# Patient Record
Sex: Male | Born: 1955 | Race: White | Hispanic: No | Marital: Married | Smoking: Former smoker
Health system: Southern US, Community
[De-identification: ages and names within clinical notes are randomized; demographics above are authoritative.]

## PROBLEM LIST (undated history)

## (undated) DIAGNOSIS — M1990 Unspecified osteoarthritis, unspecified site: Secondary | ICD-10-CM

## (undated) DIAGNOSIS — G4730 Sleep apnea, unspecified: Secondary | ICD-10-CM

## (undated) DIAGNOSIS — I10 Essential (primary) hypertension: Secondary | ICD-10-CM

## (undated) DIAGNOSIS — E785 Hyperlipidemia, unspecified: Secondary | ICD-10-CM

## (undated) HISTORY — PX: TONSILLECTOMY: SUR1361

## (undated) HISTORY — PX: WISDOM TOOTH EXTRACTION: SHX21

## (undated) HISTORY — PX: EYE SURGERY: SHX253

---

## 2013-04-14 DIAGNOSIS — R079 Chest pain, unspecified: Secondary | ICD-10-CM | POA: Insufficient documentation

## 2013-05-17 DIAGNOSIS — I10 Essential (primary) hypertension: Secondary | ICD-10-CM | POA: Insufficient documentation

## 2013-05-17 DIAGNOSIS — M5030 Other cervical disc degeneration, unspecified cervical region: Secondary | ICD-10-CM | POA: Insufficient documentation

## 2013-06-12 DIAGNOSIS — M542 Cervicalgia: Secondary | ICD-10-CM | POA: Insufficient documentation

## 2013-06-29 DIAGNOSIS — M5412 Radiculopathy, cervical region: Secondary | ICD-10-CM | POA: Insufficient documentation

## 2013-11-06 ENCOUNTER — Encounter (HOSPITAL_BASED_OUTPATIENT_CLINIC_OR_DEPARTMENT_OTHER): Payer: Self-pay | Admitting: Emergency Medicine

## 2013-11-06 ENCOUNTER — Emergency Department (HOSPITAL_BASED_OUTPATIENT_CLINIC_OR_DEPARTMENT_OTHER): Payer: BC Managed Care – PPO

## 2013-11-06 ENCOUNTER — Emergency Department (HOSPITAL_BASED_OUTPATIENT_CLINIC_OR_DEPARTMENT_OTHER)
Admission: EM | Admit: 2013-11-06 | Discharge: 2013-11-06 | Disposition: A | Discharge status: Home / Self Care | Payer: BC Managed Care – PPO | Attending: Emergency Medicine | Admitting: Emergency Medicine

## 2013-11-06 DIAGNOSIS — I10 Essential (primary) hypertension: Secondary | ICD-10-CM | POA: Insufficient documentation

## 2013-11-06 DIAGNOSIS — S62309A Unspecified fracture of unspecified metacarpal bone, initial encounter for closed fracture: Secondary | ICD-10-CM | POA: Insufficient documentation

## 2013-11-06 DIAGNOSIS — Y93B1 Activity, exercise machines primarily for muscle strengthening: Secondary | ICD-10-CM | POA: Insufficient documentation

## 2013-11-06 DIAGNOSIS — Z79899 Other long term (current) drug therapy: Secondary | ICD-10-CM | POA: Insufficient documentation

## 2013-11-06 DIAGNOSIS — Y929 Unspecified place or not applicable: Secondary | ICD-10-CM | POA: Insufficient documentation

## 2013-11-06 DIAGNOSIS — W010XXA Fall on same level from slipping, tripping and stumbling without subsequent striking against object, initial encounter: Secondary | ICD-10-CM | POA: Insufficient documentation

## 2013-11-06 DIAGNOSIS — Z87891 Personal history of nicotine dependence: Secondary | ICD-10-CM | POA: Insufficient documentation

## 2013-11-06 DIAGNOSIS — S62339A Displaced fracture of neck of unspecified metacarpal bone, initial encounter for closed fracture: Secondary | ICD-10-CM

## 2013-11-06 DIAGNOSIS — E785 Hyperlipidemia, unspecified: Secondary | ICD-10-CM | POA: Insufficient documentation

## 2013-11-06 HISTORY — DX: Essential (primary) hypertension: I10

## 2013-11-06 HISTORY — DX: Hyperlipidemia, unspecified: E78.5

## 2013-11-06 NOTE — ED Notes (Signed)
Injury to right hand sustained while moving a treadmill on 11/01/13. Bruising and pain noted.

## 2013-11-06 NOTE — ED Provider Notes (Signed)
CSN: [id number]     Arrival date & time 11/06/13  7829 History   First MD Initiated Contact with Patient 11/06/13 1000     Chief Complaint  Patient presents with  . Hand Injury   (Consider location/radiation/quality/duration/timing/severity/associated sxs/prior Treatment) Patient is a 57 y.o. male presenting with hand injury.  Hand Injury  Pt is right handed, reports 5 days ago he was moving a heavy Nordic-track exercise machine when it slipped and his R hand was forced onto the wall nearby. Complaining of moderate aching pain to ulnar right hand which has improved marginally since the injury. Pain is worse with movement.   Past Medical History  Diagnosis Date  . Hypertension   . Hyperlipidemia    Past Surgical History  Procedure Laterality Date  . Eye surgery     No family history on file. History  Substance Use Topics  . Smoking status: Former Games developer  . Smokeless tobacco: Not on file  . Alcohol Use: Yes     Comment: daily    Review of Systems All other systems reviewed and are negative except as noted in HPI.   Allergies  Review of patient's allergies indicates no known allergies.  Home Medications   Current Outpatient Rx  Name  Route  Sig  Dispense  Refill  . atorvastatin (LIPITOR) 10 MG tablet   Oral   Take 10 mg by mouth daily.         Marland Kitchen losartan (COZAAR) 100 MG tablet   Oral   Take 100 mg by mouth daily.          BP 161/90  Pulse 89  Temp(Src) 98.9 F (37.2 C) (Oral)  Resp 18  Ht 5\' 10"  (1.778 m)  Wt 242 lb (109.77 kg)  BMI 34.72 kg/m2  SpO2 98% Physical Exam  Constitutional: He is oriented to person, place, and time. He appears well-developed and well-nourished.  HENT:  Head: Normocephalic and atraumatic.  Neck: Neck supple.  Pulmonary/Chest: Effort normal.  Musculoskeletal: He exhibits edema (ulnar hand) and tenderness (5th metacarpal).  NVI  Neurological: He is alert and oriented to person, place, and time. No cranial nerve deficit.   Psychiatric: He has a normal mood and affect. His behavior is normal.    ED Course  Procedures (including critical care time) Labs Review Labs Reviewed - No data to display Imaging Review Dg Hand Complete Right  11/06/2013   CLINICAL DATA:  Crush injury with 5th metacarpal pain  EXAM: RIGHT HAND - COMPLETE 3+ VIEW  COMPARISON:  None.  FINDINGS: There is a boxer's type fracture of the 5th metacarpal with slight dorsal angulation. No extension to the articular surface. No other regional fracture.  IMPRESSION: Boxer's fracture of the 5th metacarpal   Electronically Signed   By: Paulina Fusi M.D.   On: 11/06/2013 10:28    EKG Interpretation   None       MDM   1. Boxer's fracture, closed, initial encounter     Xray images reviewed with the patient. Ulnar gutter splint, Hand followup. He declines pain meds. Ice and elevation as well.     Stark Aguinaga B. Bernette Mayers, MD 11/06/13 1046

## 2013-11-28 DIAGNOSIS — E669 Obesity, unspecified: Secondary | ICD-10-CM | POA: Insufficient documentation

## 2013-11-28 DIAGNOSIS — R7309 Other abnormal glucose: Secondary | ICD-10-CM | POA: Insufficient documentation

## 2014-09-30 DIAGNOSIS — R739 Hyperglycemia, unspecified: Secondary | ICD-10-CM | POA: Insufficient documentation

## 2015-07-04 DIAGNOSIS — F4329 Adjustment disorder with other symptoms: Secondary | ICD-10-CM | POA: Insufficient documentation

## 2015-07-04 DIAGNOSIS — G4730 Sleep apnea, unspecified: Secondary | ICD-10-CM | POA: Insufficient documentation

## 2015-10-06 DIAGNOSIS — R5382 Chronic fatigue, unspecified: Secondary | ICD-10-CM | POA: Insufficient documentation

## 2015-12-01 DIAGNOSIS — J209 Acute bronchitis, unspecified: Secondary | ICD-10-CM | POA: Insufficient documentation

## 2015-12-01 DIAGNOSIS — R7303 Prediabetes: Secondary | ICD-10-CM | POA: Insufficient documentation

## 2020-05-22 DIAGNOSIS — K760 Fatty (change of) liver, not elsewhere classified: Secondary | ICD-10-CM | POA: Insufficient documentation

## 2021-07-05 DIAGNOSIS — G4730 Sleep apnea, unspecified: Secondary | ICD-10-CM | POA: Insufficient documentation

## 2021-07-08 DIAGNOSIS — R972 Elevated prostate specific antigen [PSA]: Secondary | ICD-10-CM | POA: Insufficient documentation

## 2021-07-29 DIAGNOSIS — R1013 Epigastric pain: Secondary | ICD-10-CM | POA: Insufficient documentation

## 2021-07-29 DIAGNOSIS — Z0000 Encounter for general adult medical examination without abnormal findings: Secondary | ICD-10-CM | POA: Insufficient documentation

## 2022-01-22 ENCOUNTER — Other Ambulatory Visit (HOSPITAL_COMMUNITY): Payer: Self-pay | Admitting: Urology

## 2022-01-22 ENCOUNTER — Other Ambulatory Visit: Payer: Self-pay | Admitting: Urology

## 2022-01-22 DIAGNOSIS — C61 Malignant neoplasm of prostate: Secondary | ICD-10-CM

## 2022-01-26 ENCOUNTER — Telehealth: Payer: Self-pay | Admitting: Radiation Oncology

## 2022-01-26 NOTE — Telephone Encounter (Signed)
3/21 @ 9:44 am Left voicemail for patient to call our office.  ?

## 2022-01-29 ENCOUNTER — Encounter (HOSPITAL_COMMUNITY)
Admission: RE | Admit: 2022-01-29 | Discharge: 2022-01-29 | Disposition: A | Discharge status: Home / Self Care | Payer: Medicare Other | Source: Ambulatory Visit | Attending: Urology | Admitting: Urology

## 2022-01-29 ENCOUNTER — Other Ambulatory Visit: Payer: Self-pay

## 2022-01-29 DIAGNOSIS — C61 Malignant neoplasm of prostate: Secondary | ICD-10-CM | POA: Insufficient documentation

## 2022-01-29 MED ORDER — TECHNETIUM TC 99M MEDRONATE IV KIT
20.2000 | PACK | Freq: Once | INTRAVENOUS | Status: AC | PRN
  Administered 2022-01-29: 20.2 via INTRAVENOUS

## 2022-02-03 DIAGNOSIS — E785 Hyperlipidemia, unspecified: Secondary | ICD-10-CM | POA: Insufficient documentation

## 2022-02-03 NOTE — Progress Notes (Signed)
GU Location of Tumor / Histology: Prostate Ca ? ?If Prostate Cancer, Gleason Score is (4 + 3) and PSA is (4.3 as of 11/2021) ? ?Biopsies: ?Dr. Abner Greenspan ? ? ? ? ?Past/Anticipated interventions by urology, if any:  ? ? ?Past/Anticipated interventions by medical oncology, if any:  ?NA ?Weight changes, if any:  No ? ?IPSS:  2 ?SHIM:  14 ? ?Bowel/Bladder complaints, if any:  No ? ?Nausea/Vomiting, if any: No ? ?Pain issues, if any:  0/10 ? ?SAFETY ISSUES: ?Prior radiation?  No ?Pacemaker/ICD? No ?Possible current pregnancy? Male ?Is the patient on methotrexate? No ? ?Current Complaints / other details:   ?

## 2022-02-08 ENCOUNTER — Encounter: Payer: Self-pay | Admitting: Radiation Oncology

## 2022-02-08 ENCOUNTER — Other Ambulatory Visit: Payer: Self-pay

## 2022-02-08 ENCOUNTER — Ambulatory Visit
Admission: RE | Admit: 2022-02-08 | Discharge: 2022-02-08 | Disposition: A | Discharge status: Home / Self Care | Payer: Medicare Other | Source: Ambulatory Visit | Attending: Radiation Oncology | Admitting: Radiation Oncology

## 2022-02-08 DIAGNOSIS — C61 Malignant neoplasm of prostate: Secondary | ICD-10-CM

## 2022-02-08 NOTE — Progress Notes (Addendum)
?Radiation Oncology         [phone] ?________________________________ ?  ?Initial Outpatient Consultation - Conducted via face-to-face Video due to current COVID-19 concerns for limiting patient exposure ? ?Name: Neema Fluegge MRN: [id number]  ?Date: 02/08/2022  DOB: [birth year] ? ?[id number], Ronalee Belts, MD  Janith Lima, MD  ? ?REFERRING PHYSICIAN: Janith Lima, MD ? ?DIAGNOSIS: 66 y.o. gentleman with Stage T1c adenocarcinoma of the prostate with Gleason score of 4+3, and PSA of 4.3. ? ?  ICD-10-CM   ?1. Malignant neoplasm of prostate ([facility])  C61   ?  ? ? ?HISTORY OF PRESENT ILLNESS: Lamond Glantz is a 66 y.o. male with a diagnosis of prostate cancer. He was noted to have an elevated PSA of 4.5 in 8/22 and had negative prostate biopsy in 10/22 with TRUS volume of 42 cc.  PSA remained elevated at 4.3 in 1/23.  MRI on 11/24/21 showed 2 separat PI-RADs 4 lesions in the right anterior transition zone.  Accordingly, he was referred for evaluation in urology by Dr. Abner Greenspan,  and proceeded to transrectal ultrasound with 2 MRI fusion biopsies and 12 standard biopsies of the prostate on 01/14/22.  The prostate volume measured 45 cc.  Out of 20 cores, 4 were positive with maximum Gleason score was 4+3 seen in one of the MRI regions. ? ?The patient reviewed the biopsy results with his urologist and he has kindly been referred today for discussion of potential radiation treatment options. ? ? ?PREVIOUS RADIATION THERAPY: No ? ?PAST MEDICAL HISTORY:  ?Past Medical History:  ?Diagnosis Date  ? Hyperlipidemia   ? Hypertension   ?   ? ?PAST SURGICAL HISTORY: ?Past Surgical History:  ?Procedure Laterality Date  ? EYE SURGERY    ? ? ?FAMILY HISTORY: No family history on file. ? ?SOCIAL HISTORY:  ?Social History  ? ?Socioeconomic History  ? Marital status: Married  ?  Spouse name: Not on file  ? Number of children: Not on file  ? Years of education: Not on file  ? Highest education level: Not on file  ?Occupational History  ? Not on file   ?Tobacco Use  ? Smoking status: Former  ?  Packs/day: 1.00  ?  Years: 20.00  ?  Pack years: 20.00  ?  Types: Cigarettes  ?  Quit date: 01/07/2004  ?  Years since quitting: 18.1  ? Smokeless tobacco: Not on file  ?Substance and Sexual Activity  ? Alcohol use: Yes  ?  Comment: daily  ? Drug use: No  ? Sexual activity: Not on file  ?Other Topics Concern  ? Not on file  ?Social History Narrative  ? Not on file  ? ?Social Determinants of Health  ? ?Financial Resource Strain: Not on file  ?Food Insecurity: Not on file  ?Transportation Needs: Not on file  ?Physical Activity: Not on file  ?Stress: Not on file  ?Social Connections: Not on file  ?Intimate Partner Violence: Not on file  ? ? ?ALLERGIES: Patient has no known allergies. ? ?MEDICATIONS:  ?Current Outpatient Medications  ?Medication Sig Dispense Refill  ? atorvastatin (LIPITOR) 40 MG tablet Take 1 tablet by mouth daily.    ? ezetimibe (ZETIA) 10 MG tablet Take 10 mg by mouth daily.    ? hydrochlorothiazide (HYDRODIURIL) 25 MG tablet Take 1 tablet by mouth daily.    ? losartan (COZAAR) 100 MG tablet Take 100 mg by mouth daily.    ? Zoster Vaccine Adjuvanted [facility]) injection INJECT 0.5ML INTRAMUSCULARLY AS DIRECTEDAND  REPEAT IN 2 TO 6 MONTHS    ? atorvastatin (LIPITOR) 10 MG tablet Take 10 mg by mouth daily. (Patient not taking: Reported on 02/08/2022)    ? pantoprazole (PROTONIX) 40 MG tablet Take 1 tablet by mouth daily. (Patient not taking: Reported on 02/08/2022)    ? ?No current facility-administered medications for this encounter.  ? ? ?REVIEW OF SYSTEMS:  On review of systems, the patient reports that he is doing well overall. He denies any chest pain, shortness of breath, cough, fevers, chills, night sweats, unintended weight changes. He denies any bowel disturbances, and denies abdominal pain, nausea or vomiting. He denies any new musculoskeletal or joint aches or pains. His IPSS was Total Score: 2, indicating mild urinary symptoms (Reference 0-7 mild, 8-19  moderate, 20-35 severe).  His SHIM: 17, indicating he moderate erectile dysfunction (Reference - 22-25 None, 17-21 Mild, 8-16 Moderate, 1-7 Severe). A complete review of systems is obtained and is otherwise negative. ?  ?PHYSICAL EXAM:  ?Wt Readings from Last 3 Encounters:  ?02/08/22 260 lb (117.9 kg)  ?11/06/13 242 lb (109.8 kg)  ? ?Temp Readings from Last 3 Encounters:  ?11/06/13 98.9 ?F (37.2 ?C) (Oral)  ? ?BP Readings from Last 3 Encounters:  ?11/06/13 161/90  ? ?Pulse Readings from Last 3 Encounters:  ?11/06/13 89  ? ?Pain Assessment ?Pain Score: 0-No pain/10 ? ?In general this is a well appearing gentleman in no acute distress. He's alert and oriented x4 and appropriate throughout the examination. Cardiopulmonary assessment is negative for acute distress, and he exhibits normal effort.   ? ?KPS = 100 ? ?100 - Normal; no complaints; no evidence of disease. ?90   - Able to carry on normal activity; minor signs or symptoms of disease. ?80   - Normal activity with effort; some signs or symptoms of disease. ?104   - Cares for self; unable to carry on normal activity or to do active work. ?60   - Requires occasional assistance, but is able to care for most of his personal needs. ?50   - Requires considerable assistance and frequent medical care. ?59   - Disabled; requires special care and assistance. ?30   - Severely disabled; hospital admission is indicated although death not imminent. ?20   - Very sick; hospital admission necessary; active supportive treatment necessary. ?10   - Moribund; fatal processes progressing rapidly. ?0     - Dead ? ?Karnofsky DA, Abelmann WH, Craver LS and Burchenal [facility] [phone]) The use of the nitrogen mustards in the palliative treatment of carcinoma: with particular reference to bronchogenic carcinoma Cancer 1 [phone] ? ?LABORATORY DATA:  ?No results found for: WBC, HGB, HCT, MCV, PLT ?No results found for: NA, K, CL, CO2 ?No results found for: ALT, AST, GGT, ALKPHOS, BILITOT ?   ?RADIOGRAPHY: NM Bone Scan Whole Body ? ?Result Date: 02/01/2022 ?CLINICAL DATA:  History of prostate cancer. EXAM: NUCLEAR MEDICINE WHOLE BODY BONE SCAN TECHNIQUE: Whole body anterior and posterior images were obtained approximately 3 hours after intravenous injection of radiopharmaceutical. RADIOPHARMACEUTICALS:  20.2 mCi Technetium-51mMDP IV COMPARISON:  CT 01/29/2022. FINDINGS: Bilateral renal function and excretion. Focal areas of increased activity noted over both shoulders and sternoclavicular joints most likely degenerative. Mild focal increased activity noted over the left knee joint, most likely degenerative. Mild multifocal areas of increased activity noted over the thoracic and lumbar spine, most likely degenerative. Metastatic disease cannot be completely excluded. MRI of the thoracic and lumbar spine can be obtained for further evaluation  as needed. IMPRESSION: 1. Mild multifocal areas of increased activity noted over the thoracic and lumbar spine, most likely degenerative. Metastatic disease cannot be completely excluded. MRI of the thoracic and lumbar spine can be obtained for further evaluation as needed. 2. Focal areas of increased activity noted over both shoulders, sternoclavicular joints, and left knee, most likely degenerative. Electronically Signed   By: Marcello Moores  Register M.D.   On: 02/01/2022 10:19   ?   ?IMPRESSION/PLAN: ?1. 66 y.o. gentleman with Stage T1c adenocarcinoma of the prostate with Gleason score of 4+3, and PSA of 4.3. ?We discussed the patient's workup and outlined the nature of prostate cancer in this setting. The patient's T stage, Gleason's score, and PSA put him into the Unfavorable Intermediate risk group. Accordingly, he is eligible for a variety of potential treatment options including ADT in combination with brachytherapy, 5.5-8 weeks of external radiation, or prostatectomy. We discussed the available radiation techniques, and focused on the details and logistics of  delivery.  We discussed and outlined the risks, benefits, short and long-term effects associated with radiotherapy and compared and contrasted these with prostatectomy. We discussed the role of SpaceOAR gel in reducing the r

## 2022-02-25 ENCOUNTER — Other Ambulatory Visit: Payer: Self-pay | Admitting: Urology

## 2022-06-04 NOTE — Patient Instructions (Signed)
DUE TO SPACE LIMITATIONS, ONLY TWO VISITORS  (aged 67 and older) ARE ALLOWED TO COME WITH YOU AND STAY IN THE WAITING ROOM DURING YOUR PRE OP AND PROCEDURE.   **NO VISITORS ARE ALLOWED IN THE SHORT STAY AREA OR RECOVERY ROOM!!**  IF YOU WILL BE ADMITTED INTO THE HOSPITAL YOU ARE ALLOWED ONLY FOUR SUPPORT PEOPLE DURING VISITATION HOURS (7 AM -8PM)   The support person(s) must pass our screening, and use Hand sanitizing gel. Visitors GUEST BADGE MUST BE WORN VISIBLY  One adult visitor may remain with you overnight and MUST be in the room by 8 P.M.   You are not required to quarantine at this time prior to your surgery. However, you must do this: Hand Hygiene often Do NOT share personal items Notify your provider if you are in close contact with someone who has COVID or you develop fever 100.4 or greater, new onset of sneezing, cough, sore throat, shortness of breath or body aches.       Your procedure is scheduled on:  Monday June 14, 2022  Report to [facility] Main Entrance.  Report to admitting at:  05:15   AM  +++++Call this number if you have any questions or problems the morning of surgery [phone]   HAVE A CLEAR LIQUID DIET THE DAY BEFORE YOUR SURGERY.  MIRALAX - Dissolve 17 grams in 4 ounces of water AND DRINK AT NOON DAY BEFORE YOUR SURGERY  FLEET ENEMA USE ONE FLEET ENEMA NIGHT BEFORE SURGERY              After Midnight you may have the following liquids until   04:15  AM   Clear Liquid Diet Water Black Coffee (sugar ok, NO MILK/CREAM OR CREAMERS)  Tea (sugar ok, NO MILK/CREAM OR CREAMERS) regular and decaf                             Plain Jell-O (NO RED)                                           Fruit ices (not with fruit pulp, NO RED)                                     Popsicles (NO RED)                                                                  Juice: apple, WHITE grape, WHITE cranberry Sports drinks like Gatorade (NO RED)                 FOLLOW ANY ADDITIONAL PRE OP INSTRUCTIONS YOU RECEIVED FROM YOUR SURGEON'S OFFICE!!!   Oral Hygiene is also important to reduce your risk of infection.        Remember - BRUSH YOUR TEETH THE MORNING OF SURGERY WITH YOUR REGULAR TOOTHPASTE    Take ONLY these medicines the morning of surgery with A SIP OF WATER: Zetia, Klonopin and Protonix if needed   Bring CPAP mask and tubing day of  surgery.                   You may not have any metal on your body including  jewelry, and body piercing  Do not wear lotions, powders, cologne, or deodorant  Men may shave face and neck.  Contacts, Hearing Aids, dentures or bridgework may not be worn into surgery.   You may bring a small overnight bag with you on the day of surgery, only pack items that are not valuable .[facility] IS NOT RESPONSIBLE   FOR VALUABLES THAT ARE LOST OR STOLEN.   DO NOT [place]. PHARMACY WILL DISPENSE MEDICATIONS LISTED ON YOUR MEDICATION LIST TO YOU DURING YOUR ADMISSION [place]!   Special Instructions: Bring a copy of your healthcare power of attorney and living will documents the day of surgery, if you wish to have them scanned into your [facility] Records- EPIC  Please read over the following fact sheets you were given: IF YOU HAVE QUESTIONS ABOUT YOUR PRE-OP INSTRUCTIONS, PLEASE CALL [phone]  ([place])   [facility] - Preparing for Surgery Before surgery, you can play an important role.  Because skin is not sterile, your skin needs to be as free of germs as possible.  You can reduce the number of germs on your skin by washing with CHG (chlorahexidine gluconate) soap before surgery.  CHG is an antiseptic cleaner which kills germs and bonds with the skin to continue killing germs even after washing. Please DO NOT use if you have an allergy to CHG or antibacterial soaps.  If your skin becomes reddened/irritated stop using the CHG and inform your nurse when you  arrive at Short Stay. Do not shave (including legs and underarms) for at least 48 hours prior to the first CHG shower.  You may shave your face/neck.  Please follow these instructions carefully:  1.  Shower with CHG Soap the night before surgery and the  morning of surgery.  2.  If you choose to wash your hair, wash your hair first as usual with your normal  shampoo.  3.  After you shampoo, rinse your hair and body thoroughly to remove the shampoo.                             4.  Use CHG as you would any other liquid soap.  You can apply chg directly to the skin and wash.  Gently with a scrungie or clean washcloth.  5.  Apply the CHG Soap to your body ONLY FROM THE NECK DOWN.   Do not use on face/ open                           Wound or open sores. Avoid contact with eyes, ears mouth and genitals (private parts).                       Wash face,  Genitals (private parts) with your normal soap.             6.  Wash thoroughly, paying special attention to the area where your  surgery  will be performed.  7.  Thoroughly rinse your body with warm water from the neck down.  8.  DO NOT shower/wash with your normal soap after using and rinsing off the CHG Soap.  9.  Pat yourself dry with a clean towel.            10.  Wear clean pajamas.            11.  Place clean sheets on your bed the night of your first shower and do not  sleep with pets.  ON THE DAY OF SURGERY : Do not apply any lotions/deodorants the morning of surgery.  Please wear clean clothes to the hospital/surgery center.    FAILURE TO FOLLOW THESE INSTRUCTIONS MAY RESULT IN THE CANCELLATION OF YOUR SURGERY  PATIENT SIGNATURE_________________________________  NURSE SIGNATURE__________________________________  ________________________________________________________________________   Adam Phenix    An incentive spirometer is a tool that can help keep your lungs clear and active. This tool measures how well  you are filling your lungs with each breath. Taking long deep breaths may help reverse or decrease the chance of developing breathing (pulmonary) problems (especially infection) following: A long period of time when you are unable to move or be active. BEFORE THE PROCEDURE  If the spirometer includes an indicator to show your best effort, your nurse or respiratory therapist will set it to a desired goal. If possible, sit up straight or lean slightly forward. Try not to slouch. Hold the incentive spirometer in an upright position. INSTRUCTIONS FOR USE  Sit on the edge of your bed if possible, or sit up as far as you can in bed or on a chair. Hold the incentive spirometer in an upright position. Breathe out normally. Place the mouthpiece in your mouth and seal your lips tightly around it. Breathe in slowly and as deeply as possible, raising the piston or the ball toward the top of the column. Hold your breath for 3-5 seconds or for as long as possible. Allow the piston or ball to fall to the bottom of the column. Remove the mouthpiece from your mouth and breathe out normally. Rest for a few seconds and repeat Steps 1 through 7 at least 10 times every 1-2 hours when you are awake. Take your time and take a few normal breaths between deep breaths. The spirometer may include an indicator to show your best effort. Use the indicator as a goal to work toward during each repetition. After each set of 10 deep breaths, practice coughing to be sure your lungs are clear. If you have an incision (the cut made at the time of surgery), support your incision when coughing by placing a pillow or rolled up towels firmly against it. Once you are able to get out of bed, walk around indoors and cough well. You may stop using the incentive spirometer when instructed by your caregiver.  RISKS AND COMPLICATIONS Take your time so you do not get dizzy or light-headed. If you are in pain, you may need to take or ask for  pain medication before doing incentive spirometry. It is harder to take a deep breath if you are having pain. AFTER USE Rest and breathe slowly and easily. It can be helpful to keep track of a log of your progress. Your caregiver can provide you with a simple table to help with this. If you are using the spirometer at home, follow these instructions: [place] IF:  You are having difficultly using the spirometer. You have trouble using the spirometer as often as instructed. Your pain medication is not giving enough relief while using the spirometer. You develop fever of 100.5 F (38.1 C) or higher.  SEEK IMMEDIATE MEDICAL CARE IF:  You cough up bloody sputum that had not been present before. You develop fever of 102 F (38.9 C) or greater. You develop worsening pain at or near the incision site. MAKE SURE YOU:  Understand these instructions. Will watch your condition. Will get help right away if you are not doing well or get worse. Document Released: 03/07/2007 Document Revised: 01/17/2012 Document Reviewed: 05/08/2007 [facility] Patient Information 2014 [place], [place].

## 2022-06-04 NOTE — Progress Notes (Addendum)
COVID Vaccine received:  []  No [x]  Yes  Date of any COVID positive Test in last 90 days:  PCP - Patria Mane, MD Cardiologist - none  Chest x-ray - 06-2021  CEW EKG -   Stress Test - 20+ years ago doesn't remember  ECHO -  Cardiac Cath -   Pacemaker/ICD device     [x]  N/A Spinal Cord Stimulator:[x]  No []  Yes   Other Implants:   Bowel Prep - Clears day prior, Miralax and Fleet Enema  History of Sleep Apnea? []  No [x]  Yes  CPAP used?- []  No [x]  Yes  (Instruct to bring their mask & Tubing)  Does the patient monitor blood sugar? [x]  No []  Yes  []  N/A  Blood Thinner Instructions:None Aspirin Instructions: Last Dose:  ERAS Protocol Ordered: [x]  No  []  Yes  Comments:   Anesthesia review: pre-DM, HTN, Fatty liver  Patient denies shortness of breath, fever, cough and chest pain at PAT appointment  Patient verbalized understanding and agreement to the Pre-Surgical Instructions that were given to them at this PAT appointment. Patient was also educated of the need to review these PAT instructions again prior to his/her surgery.I reviewed the appropriate phone numbers to call if they have any and questions or concerns.

## 2022-06-07 ENCOUNTER — Encounter (HOSPITAL_COMMUNITY): Payer: Self-pay

## 2022-06-07 ENCOUNTER — Encounter (HOSPITAL_COMMUNITY)
Admission: RE | Admit: 2022-06-07 | Discharge: 2022-06-07 | Disposition: A | Discharge status: Home / Self Care | Payer: Medicare Other | Source: Ambulatory Visit | Attending: Urology | Admitting: Urology

## 2022-06-07 ENCOUNTER — Other Ambulatory Visit: Payer: Self-pay

## 2022-06-07 VITALS — BP 130/81 | HR 58 | Temp 98.4°F | Resp 14 | Ht 70.0 in | Wt 249.0 lb

## 2022-06-07 DIAGNOSIS — R7303 Prediabetes: Secondary | ICD-10-CM | POA: Diagnosis not present

## 2022-06-07 DIAGNOSIS — I10 Essential (primary) hypertension: Secondary | ICD-10-CM | POA: Diagnosis not present

## 2022-06-07 DIAGNOSIS — Z01812 Encounter for preprocedural laboratory examination: Secondary | ICD-10-CM | POA: Insufficient documentation

## 2022-06-07 DIAGNOSIS — K769 Liver disease, unspecified: Secondary | ICD-10-CM | POA: Diagnosis not present

## 2022-06-07 HISTORY — DX: Sleep apnea, unspecified: G47.30

## 2022-06-07 HISTORY — DX: Unspecified osteoarthritis, unspecified site: M19.90

## 2022-06-07 LAB — COMPREHENSIVE METABOLIC PANEL
ALT: 31 U/L (ref 0–44)
AST: 28 U/L (ref 15–41)
Albumin: 3.9 g/dL (ref 3.5–5.0)
Alkaline Phosphatase: 58 U/L (ref 38–126)
Anion gap: 10 (ref 5–15)
BUN: 15 mg/dL (ref 8–23)
CO2: 24 mmol/L (ref 22–32)
Calcium: 9 mg/dL (ref 8.9–10.3)
Chloride: 104 mmol/L (ref 98–111)
Creatinine, Ser: 0.93 mg/dL (ref 0.61–1.24)
GFR, Estimated: >60 mL/min (ref >60)
Glucose, Bld: 105 mg/dL — ABNORMAL HIGH (ref 70–99)
Potassium: 3.9 mmol/L (ref 3.5–5.1)
Sodium: 138 mmol/L (ref 135–145)
Total Bilirubin: 0.8 mg/dL (ref 0.3–1.2)
Total Protein: 7.2 g/dL (ref 6.5–8.1)

## 2022-06-07 LAB — CBC
HCT: 42.4 % (ref 39.0–52.0)
Hemoglobin: 14.2 g/dL (ref 13.0–17.0)
MCH: 29.6 pg (ref 26.0–34.0)
MCHC: 33.5 g/dL (ref 30.0–36.0)
MCV: 88.3 fL (ref 80.0–100.0)
Platelets: 219 10*3/uL (ref 150–400)
RBC: 4.8 MIL/uL (ref 4.22–5.81)
RDW: 14.5 % (ref 11.5–15.5)
WBC: 6.6 10*3/uL (ref 4.0–10.5)
nRBC: 0 % (ref 0.0–0.2)

## 2022-06-07 LAB — HEMOGLOBIN A1C
Hgb A1c MFr Bld: 6.1 % — ABNORMAL HIGH (ref 4.8–5.6)
Mean Plasma Glucose: 128.37 mg/dL

## 2022-06-07 LAB — GLUCOSE, CAPILLARY: Glucose-Capillary: 117 mg/dL — ABNORMAL HIGH (ref 70–99)

## 2022-06-11 NOTE — H&P (Signed)
Office Visit Report     05/21/2022   --------------------------------------------------------------------------------   Ernest Meyer  MRN: [id number]  DOB: 02-15-1956, 66 year old Male  SSN:    PRIMARY CARE:    REFERRING:  Ernest Meyer, M  PROVIDER:  Rexene Meyer, M.D.  TREATING:  Ernest Crocker, NP  LOCATION:  [facility] [phone]     --------------------------------------------------------------------------------   CC/HPI: CC: Prostate Cancer   Physician requesting consult: Dr. Windell Meyer  PCP:   Ernest Meyer is a 66 year old gentleman who was noted to have an elevated PSA of 4.5 in the fall of 2022 prompting urologic evaluation and TRUS biopsy by Dr. Baruch Gouty in October 2022 that was negative for malignancy. An MRI of the prostate was obtained in January 2023 when his PSA was still 4.3. This indicated two separate PI-RAD 4 lesions of the right anterior transition zone. He was seen by Dr. Abner Greenspan and an MR/US fusion biopsy was performed on 01/14/22 which confirmed one of the MR lesions to have all 4 cores positive for malignancy with one core indicating Gleason 4+3=7 adenocarcinoma. The remaining biopsies were negative with a total of 4 out of 20 biopsy cores positive.   Family history: None.   Imaging studies:  MRI (11/24/21): No EPE, SVI, LAD, or bone lesions.  CT abd/pelvis (01/29/22): Negative for metastatic disease.  Bone scan (01/29/22): Negative for metastatic disease.   PMH: He has a history of hypertension, hyperglycemia, and sleep apnea.  PSH: No abdominal surgeries.   TNM stage: cT1c N0 M0  PSA: 4.3  Gleason score: 4+3=7 (GG 3)  Biopsy (01/14/22): 4/20 cores positive  Left: Benign  Right: Benign  MR targets: ROI 1 - 4/4 cores positive (60%, 70%, 40%, 25%), ROI 2 - Benign  Prostate volume: 45 cc   Nomogram  OC disease: 66%  EPE: 33%  SVI: 3%  LNI: 4%  PFS (5 year, 10 year): 76%, 63%   Urinary function: IPSS is 20.  Erectile function:  SHIM score is 4.   05/21/2022. Patient here today for preoperative appointment prior to undergoing robotic prostatectomy and bilateral pelvic lymph node dissection with Dr. Alinda Meyer on August 7. Overall doing well. Denies any changes in past medical history, prescription medications taken on a daily basis, no interval surgical or procedural intervention. He continues to void at his baseline with stable symptomology. Denies any interval dysuria, gross hematuria. No interval treatment for UTI or other infectious process. Patient denies any recent chest pain, shortness of breath, lightheadedness or dizziness. Denies any interval fevers or chills, nausea/vomiting.     ALLERGIES: No Known Drug Allergies    MEDICATIONS: Hydrochlorothiazide  Atorvastatin Calcium  Ezetimibe  Losartan Potassium     GU PSH: Locm 300-'399Mg'$ /Ml Iodine,1Ml - 01/29/2022 Prostate Needle Biopsy - 01/14/2022     NON-GU PSH: Surgical Pathology, Gross And Microscopic Examination For Prostate Needle - 01/14/2022     GU PMH: Stress Incontinence - 04/21/2022, - 03/22/2022, - 02/16/2022 Prostate Cancer - 02/16/2022, - 02/09/2022, - 01/29/2022, - 01/21/2022 Elevated PSA - 01/14/2022    NON-GU PMH: Muscle weakness (generalized) - 04/21/2022, - 03/22/2022, - 02/16/2022 Other muscle spasm - 04/21/2022, - 03/22/2022, - 02/16/2022 Hypercholesterolemia Hypertension Sleep Apnea    FAMILY HISTORY: 1 Daughter - Runs in Family 2 sons - Runs in Family   SOCIAL HISTORY: Marital Status: Unknown Current Smoking Status: Patient does not smoke anymore. Has not smoked since 01/07/2004. Smoked for 20 years.   Tobacco  Use Assessment Completed: Used Tobacco in last 30 days? Does drink.  Drinks 1 caffeinated drink per day.    REVIEW OF SYSTEMS:    GU Review Male:   Patient reports get up at night to urinate. Patient denies frequent urination, hard to postpone urination, burning/ pain with urination, leakage of urine, stream starts and stops, trouble  starting your stream, have to strain to urinate , erection problems, and penile pain.  Gastrointestinal (Upper):   Patient denies nausea, vomiting, and indigestion/ heartburn.  Gastrointestinal (Lower):   Patient denies diarrhea and constipation.  Constitutional:   Patient denies fever, night sweats, weight loss, and fatigue.  Skin:   Patient denies skin rash/ lesion and itching.  Eyes:   Patient denies blurred vision and double vision.  Ears/ Nose/ Throat:   Patient denies sore throat and sinus problems.  Hematologic/Lymphatic:   Patient denies swollen glands and easy bruising.  Cardiovascular:   Patient denies leg swelling and chest pains.  Respiratory:   Patient denies cough and shortness of breath.  Endocrine:   Patient denies excessive thirst.  Musculoskeletal:   Patient denies back pain and joint pain.  Neurological:   Patient denies headaches and dizziness.  Psychologic:   Patient denies depression and anxiety.   VITAL SIGNS:      05/21/2022 08:31 AM  Weight 250 lb / 113.4 kg  Height 70 in / 177.8 cm  BP 115/75 mmHg  Heart Rate 53 /min  Temperature 98.4 F / 36.8 C  BMI 35.9 kg/m   MULTI-SYSTEM PHYSICAL EXAMINATION:    Constitutional: Well-nourished. No physical deformities. Normally developed. Good grooming.  Neck: Neck symmetrical, not swollen. Normal tracheal position.  Respiratory: Normal breath sounds. No labored breathing, no use of accessory muscles.   Cardiovascular: Regular rate and rhythm. No murmur, no gallop. Normal temperature, normal extremity pulses, no swelling, no varicosities.   Skin: No paleness, no jaundice, no cyanosis. No lesion, no ulcer, no rash.  Neurologic / Psychiatric: Oriented to time, oriented to place, oriented to person. No depression, no anxiety, no agitation.  Gastrointestinal: Obese abdomen. No mass, no tenderness, no rigidity.   Musculoskeletal: Normal gait and station of head and neck.     Complexity of Data:  Source Of History:   Patient, Medical Record Summary  Lab Test Review:   PSA  Records Review:   Pathology Reports, Previous Doctor Records, Previous Hospital Records, Previous Patient Records  Urine Test Review:   Urinalysis  X-Ray Review: C.T. Abdomen/Pelvis: Reviewed Films. Reviewed Report.     05/21/22  Urinalysis  Urine Appearance Clear   Urine Color Yellow   Urine Glucose Neg mg/dL  Urine Bilirubin Neg mg/dL  Urine Ketones Neg mg/dL  Urine Specific Gravity 1.015   Urine Blood Neg ery/uL  Urine pH 6.5   Urine Protein Neg mg/dL  Urine Urobilinogen 0.2 mg/dL  Urine Nitrites Neg   Urine Leukocyte Esterase Neg leu/uL   PROCEDURES:          Urinalysis Dipstick Dipstick Cont'd  Color: Yellow Bilirubin: Neg mg/dL  Appearance: Clear Ketones: Neg mg/dL  Specific Gravity: 1.015 Blood: Neg ery/uL  pH: 6.5 Protein: Neg mg/dL  Glucose: Neg mg/dL Urobilinogen: 0.2 mg/dL    Nitrites: Neg    Leukocyte Esterase: Neg leu/uL    ASSESSMENT:      ICD-10 Details  1 GU:   Prostate Cancer - C61 Chronic, Threat to Bodily Function  2 NON-GU:   Encounter for other preprocedural examination - Z01.818 Undiagnosed New Problem  PLAN:           Orders Labs Urine Culture          Schedule Return Visit/Planned Activity: Keep Scheduled Appointment - Follow up MD, Schedule Surgery          Document Letter(s):  Created for Chart: Work Excuse   Created for Patient: Clinical Summary         Notes:   All questions answered to the best my ability regarding the upcoming procedure and expected postoperative course with understanding expressed by the patient. Precautionary urine culture sent today to serve as a baseline. He will follow-up with pelvic floor physical therapy on 7/21. He will proceed with previously scheduled prostatectomy with Dr. Alinda Meyer on 8/7.        Next Appointment:      Next Appointment: 05/28/2022 09:00 AM    Appointment Type: 60 Physical Therapy    Location: [facility] [phone]    Provider: Lovenia Kim    Reason for Visit: PT      * Signed by Ernest Crocker, NP on 05/21/22 at 8:58 AM (EDT*

## 2022-06-14 ENCOUNTER — Encounter (HOSPITAL_COMMUNITY)
Admission: RE | Disposition: A | Discharge status: Home / Self Care | Payer: Self-pay | Source: Home / Self Care | Attending: Urology

## 2022-06-14 ENCOUNTER — Ambulatory Visit (HOSPITAL_COMMUNITY): Payer: Medicare Other | Admitting: Registered Nurse

## 2022-06-14 ENCOUNTER — Ambulatory Visit (HOSPITAL_BASED_OUTPATIENT_CLINIC_OR_DEPARTMENT_OTHER): Payer: Medicare Other | Admitting: Registered Nurse

## 2022-06-14 ENCOUNTER — Encounter (HOSPITAL_COMMUNITY): Payer: Self-pay | Admitting: Urology

## 2022-06-14 ENCOUNTER — Observation Stay (HOSPITAL_COMMUNITY)
Admission: RE | Admit: 2022-06-14 | Discharge: 2022-06-15 | Disposition: A | Discharge status: Home / Self Care | Payer: Medicare Other | Attending: Urology | Admitting: Urology

## 2022-06-14 ENCOUNTER — Other Ambulatory Visit: Payer: Self-pay

## 2022-06-14 DIAGNOSIS — Z79899 Other long term (current) drug therapy: Secondary | ICD-10-CM | POA: Diagnosis not present

## 2022-06-14 DIAGNOSIS — Z87891 Personal history of nicotine dependence: Secondary | ICD-10-CM | POA: Diagnosis not present

## 2022-06-14 DIAGNOSIS — I10 Essential (primary) hypertension: Secondary | ICD-10-CM | POA: Insufficient documentation

## 2022-06-14 DIAGNOSIS — R7303 Prediabetes: Secondary | ICD-10-CM

## 2022-06-14 DIAGNOSIS — C61 Malignant neoplasm of prostate: Secondary | ICD-10-CM | POA: Diagnosis not present

## 2022-06-14 DIAGNOSIS — G4730 Sleep apnea, unspecified: Secondary | ICD-10-CM | POA: Insufficient documentation

## 2022-06-14 HISTORY — PX: ROBOT ASSISTED LAPAROSCOPIC RADICAL PROSTATECTOMY: SHX5141

## 2022-06-14 HISTORY — PX: LYMPHADENECTOMY: SHX5960

## 2022-06-14 LAB — GLUCOSE, CAPILLARY: Glucose-Capillary: 113 mg/dL — ABNORMAL HIGH (ref 70–99)

## 2022-06-14 LAB — HEMOGLOBIN AND HEMATOCRIT, BLOOD
HCT: 42.1 % (ref 39.0–52.0)
Hemoglobin: 13.8 g/dL (ref 13.0–17.0)

## 2022-06-14 SURGERY — XI ROBOTIC ASSISTED LAPAROSCOPIC RADICAL PROSTATECTOMY LEVEL 2
Anesthesia: General

## 2022-06-14 MED ORDER — ATORVASTATIN CALCIUM 40 MG PO TABS
40.0000 mg | ORAL_TABLET | Freq: Every day | ORAL | Status: DC
  Administered 2022-06-14: 40 mg via ORAL
  Filled 2022-06-14: qty 1

## 2022-06-14 MED ORDER — ROCURONIUM BROMIDE 10 MG/ML (PF) SYRINGE
PREFILLED_SYRINGE | INTRAVENOUS | Status: DC | PRN
  Administered 2022-06-14: 20 mg via INTRAVENOUS
  Administered 2022-06-14: 70 mg via INTRAVENOUS
  Administered 2022-06-14: 30 mg via INTRAVENOUS

## 2022-06-14 MED ORDER — SODIUM CHLORIDE 0.9 % IR SOLN
Status: DC | PRN
  Administered 2022-06-14: 1000 mL via INTRAVESICAL

## 2022-06-14 MED ORDER — ACETAMINOPHEN 325 MG PO TABS: 650.0000 mg | ORAL_TABLET | ORAL | Status: DC | PRN

## 2022-06-14 MED ORDER — ROCURONIUM BROMIDE 10 MG/ML (PF) SYRINGE
PREFILLED_SYRINGE | INTRAVENOUS | Status: AC
  Filled 2022-06-14: qty 10

## 2022-06-14 MED ORDER — ONDANSETRON HCL 4 MG/2ML IJ SOLN
INTRAMUSCULAR | Status: DC | PRN
  Administered 2022-06-14: 4 mg via INTRAVENOUS

## 2022-06-14 MED ORDER — FENTANYL CITRATE (PF) 250 MCG/5ML IJ SOLN
INTRAMUSCULAR | Status: AC
  Filled 2022-06-14: qty 5

## 2022-06-14 MED ORDER — HYDRALAZINE HCL 20 MG/ML IJ SOLN
INTRAMUSCULAR | Status: AC
  Filled 2022-06-14: qty 1

## 2022-06-14 MED ORDER — LABETALOL HCL 5 MG/ML IV SOLN
INTRAVENOUS | Status: AC
  Filled 2022-06-14: qty 4

## 2022-06-14 MED ORDER — PANTOPRAZOLE SODIUM 40 MG PO TBEC: 40.0000 mg | DELAYED_RELEASE_TABLET | Freq: Every day | ORAL | Status: DC | PRN

## 2022-06-14 MED ORDER — CEFAZOLIN SODIUM-DEXTROSE 2-4 GM/100ML-% IV SOLN
2.0000 g | INTRAVENOUS | Status: AC
  Administered 2022-06-14: 2 g via INTRAVENOUS
  Filled 2022-06-14: qty 100

## 2022-06-14 MED ORDER — SULFAMETHOXAZOLE-TRIMETHOPRIM 800-160 MG PO TABS: 1.0000 | ORAL_TABLET | Freq: Two times a day (BID) | ORAL | 0 refills | Status: AC

## 2022-06-14 MED ORDER — SUGAMMADEX SODIUM 200 MG/2ML IV SOLN
INTRAVENOUS | Status: DC | PRN
  Administered 2022-06-14: 200 mg via INTRAVENOUS

## 2022-06-14 MED ORDER — DIPHENHYDRAMINE HCL 12.5 MG/5ML PO ELIX: 12.5000 mg | ORAL_SOLUTION | Freq: Four times a day (QID) | ORAL | Status: DC | PRN

## 2022-06-14 MED ORDER — KETAMINE HCL 10 MG/ML IJ SOLN
INTRAMUSCULAR | Status: DC | PRN
  Administered 2022-06-14: 20 mg via INTRAVENOUS

## 2022-06-14 MED ORDER — SODIUM CHLORIDE 0.9 % IV BOLUS
1000.0000 mL | Freq: Once | INTRAVENOUS | Status: AC
  Administered 2022-06-14: 1000 mL via INTRAVENOUS

## 2022-06-14 MED ORDER — FLEET ENEMA 7-19 GM/118ML RE ENEM
1.0000 | ENEMA | Freq: Once | RECTAL | Status: DC
  Filled 2022-06-14: qty 1

## 2022-06-14 MED ORDER — EZETIMIBE 10 MG PO TABS
10.0000 mg | ORAL_TABLET | Freq: Every day | ORAL | Status: DC
Start: 2022-06-14 — End: 2022-06-15
  Administered 2022-06-14 – 2022-06-15 (×2): 10 mg via ORAL
  Filled 2022-06-14 (×2): qty 1

## 2022-06-14 MED ORDER — LACTATED RINGERS IV SOLN: INTRAVENOUS | Status: DC | PRN

## 2022-06-14 MED ORDER — DIPHENHYDRAMINE HCL 50 MG/ML IJ SOLN: 12.5000 mg | Freq: Four times a day (QID) | INTRAMUSCULAR | Status: DC | PRN

## 2022-06-14 MED ORDER — CHLORHEXIDINE GLUCONATE 0.12 % MT SOLN
15.0000 mL | Freq: Once | OROMUCOSAL | Status: AC
  Administered 2022-06-14: 15 mL via OROMUCOSAL

## 2022-06-14 MED ORDER — HYOSCYAMINE SULFATE 0.125 MG SL SUBL: 0.1250 mg | SUBLINGUAL_TABLET | Freq: Four times a day (QID) | SUBLINGUAL | Status: DC | PRN

## 2022-06-14 MED ORDER — HYDRALAZINE HCL 20 MG/ML IJ SOLN
INTRAMUSCULAR | Status: DC | PRN
  Administered 2022-06-14: 2 mg via INTRAVENOUS

## 2022-06-14 MED ORDER — DOCUSATE SODIUM 100 MG PO CAPS: 100.0000 mg | ORAL_CAPSULE | Freq: Two times a day (BID) | ORAL | Status: AC

## 2022-06-14 MED ORDER — TRIPLE ANTIBIOTIC 3.5-400-5000 EX OINT
1.0000 | TOPICAL_OINTMENT | Freq: Three times a day (TID) | CUTANEOUS | Status: DC | PRN
Start: 2022-06-14 — End: 2022-06-15

## 2022-06-14 MED ORDER — STERILE WATER FOR IRRIGATION IR SOLN
Status: DC | PRN
  Administered 2022-06-14: 1000 mL

## 2022-06-14 MED ORDER — ORAL CARE MOUTH RINSE: 15.0000 mL | Freq: Once | OROMUCOSAL | Status: AC

## 2022-06-14 MED ORDER — DEXAMETHASONE SODIUM PHOSPHATE 10 MG/ML IJ SOLN
INTRAMUSCULAR | Status: DC | PRN
  Administered 2022-06-14: 8 mg via INTRAVENOUS

## 2022-06-14 MED ORDER — LACTATED RINGERS IV SOLN: INTRAVENOUS | Status: DC

## 2022-06-14 MED ORDER — KCL IN DEXTROSE-NACL 20-5-0.45 MEQ/L-%-% IV SOLN
INTRAVENOUS | Status: DC
  Filled 2022-06-14 (×3): qty 1000

## 2022-06-14 MED ORDER — LIDOCAINE HCL (PF) 2 % IJ SOLN
INTRAMUSCULAR | Status: AC
  Filled 2022-06-14: qty 5

## 2022-06-14 MED ORDER — FENTANYL CITRATE (PF) 250 MCG/5ML IJ SOLN
INTRAMUSCULAR | Status: DC | PRN
  Administered 2022-06-14 (×3): 50 ug via INTRAVENOUS
  Administered 2022-06-14: 100 ug via INTRAVENOUS

## 2022-06-14 MED ORDER — ONDANSETRON HCL 4 MG/2ML IJ SOLN: 4.0000 mg | INTRAMUSCULAR | Status: DC | PRN

## 2022-06-14 MED ORDER — CLONAZEPAM 0.5 MG PO TABS: 0.5000 mg | ORAL_TABLET | Freq: Two times a day (BID) | ORAL | Status: DC | PRN

## 2022-06-14 MED ORDER — AMISULPRIDE (ANTIEMETIC) 5 MG/2ML IV SOLN: 10.0000 mg | Freq: Once | INTRAVENOUS | Status: DC | PRN

## 2022-06-14 MED ORDER — PHENYLEPHRINE HCL-NACL 20-0.9 MG/250ML-% IV SOLN
INTRAVENOUS | Status: AC
  Filled 2022-06-14: qty 250

## 2022-06-14 MED ORDER — BUPIVACAINE-EPINEPHRINE 0.25% -1:200000 IJ SOLN
INTRAMUSCULAR | Status: DC | PRN
  Administered 2022-06-14: 30 mL

## 2022-06-14 MED ORDER — KETOROLAC TROMETHAMINE 15 MG/ML IJ SOLN
15.0000 mg | Freq: Four times a day (QID) | INTRAMUSCULAR | Status: DC
  Administered 2022-06-14 – 2022-06-15 (×3): 15 mg via INTRAVENOUS
  Filled 2022-06-14 (×4): qty 1

## 2022-06-14 MED ORDER — MIDAZOLAM HCL 2 MG/2ML IJ SOLN
INTRAMUSCULAR | Status: AC
  Filled 2022-06-14: qty 2

## 2022-06-14 MED ORDER — TRAMADOL HCL 50 MG PO TABS: 50.0000 mg | ORAL_TABLET | Freq: Four times a day (QID) | ORAL | 0 refills | Status: AC | PRN

## 2022-06-14 MED ORDER — LOSARTAN POTASSIUM 50 MG PO TABS
100.0000 mg | ORAL_TABLET | Freq: Every day | ORAL | Status: DC
Start: 2022-06-14 — End: 2022-06-15
  Administered 2022-06-14 – 2022-06-15 (×2): 100 mg via ORAL
  Filled 2022-06-14 (×2): qty 2

## 2022-06-14 MED ORDER — LIDOCAINE 2% (20 MG/ML) 5 ML SYRINGE
INTRAMUSCULAR | Status: DC | PRN
  Administered 2022-06-14: 60 mg via INTRAVENOUS

## 2022-06-14 MED ORDER — LABETALOL HCL 5 MG/ML IV SOLN
INTRAVENOUS | Status: DC | PRN
  Administered 2022-06-14: 5 mg via INTRAVENOUS

## 2022-06-14 MED ORDER — SODIUM CHLORIDE (PF) 0.9 % IJ SOLN
INTRAMUSCULAR | Status: AC
  Filled 2022-06-14: qty 10

## 2022-06-14 MED ORDER — LACTATED RINGERS IV SOLN
INTRAVENOUS | Status: DC | PRN
  Administered 2022-06-14: 1000 mL

## 2022-06-14 MED ORDER — POLYETHYLENE GLYCOL 3350 17 G PO PACK
17.0000 g | PACK | Freq: Every day | ORAL | Status: DC
  Filled 2022-06-14: qty 1

## 2022-06-14 MED ORDER — MIDAZOLAM HCL 5 MG/5ML IJ SOLN
INTRAMUSCULAR | Status: DC | PRN
  Administered 2022-06-14: 2 mg via INTRAVENOUS

## 2022-06-14 MED ORDER — BUPIVACAINE-EPINEPHRINE (PF) 0.25% -1:200000 IJ SOLN
INTRAMUSCULAR | Status: AC
  Filled 2022-06-14: qty 30

## 2022-06-14 MED ORDER — KETAMINE HCL 50 MG/5ML IJ SOSY
PREFILLED_SYRINGE | INTRAMUSCULAR | Status: AC
  Filled 2022-06-14: qty 5

## 2022-06-14 MED ORDER — PROPOFOL 10 MG/ML IV BOLUS
INTRAVENOUS | Status: DC | PRN
  Administered 2022-06-14: 200 mg via INTRAVENOUS

## 2022-06-14 MED ORDER — NAPHAZOLINE-GLYCERIN 0.012-0.25 % OP SOLN: 1.0000 [drp] | Freq: Four times a day (QID) | OPHTHALMIC | Status: DC | PRN

## 2022-06-14 MED ORDER — MORPHINE SULFATE (PF) 2 MG/ML IV SOLN: 2.0000 mg | INTRAVENOUS | Status: DC | PRN

## 2022-06-14 MED ORDER — ZOLPIDEM TARTRATE 5 MG PO TABS: 5.0000 mg | ORAL_TABLET | Freq: Every evening | ORAL | Status: DC | PRN

## 2022-06-14 MED ORDER — DOCUSATE SODIUM 100 MG PO CAPS
100.0000 mg | ORAL_CAPSULE | Freq: Two times a day (BID) | ORAL | Status: DC
  Administered 2022-06-14 – 2022-06-15 (×2): 100 mg via ORAL
  Filled 2022-06-14 (×2): qty 1

## 2022-06-14 MED ORDER — CEFAZOLIN SODIUM-DEXTROSE 1-4 GM/50ML-% IV SOLN
1.0000 g | Freq: Three times a day (TID) | INTRAVENOUS | Status: AC
  Administered 2022-06-14 (×2): 1 g via INTRAVENOUS
  Filled 2022-06-14 (×2): qty 50

## 2022-06-14 MED ORDER — DEXAMETHASONE SODIUM PHOSPHATE 10 MG/ML IJ SOLN
INTRAMUSCULAR | Status: AC
  Filled 2022-06-14: qty 1

## 2022-06-14 MED ORDER — FENTANYL CITRATE PF 50 MCG/ML IJ SOSY: 25.0000 ug | PREFILLED_SYRINGE | INTRAMUSCULAR | Status: DC | PRN

## 2022-06-14 MED ORDER — HYDROCHLOROTHIAZIDE 25 MG PO TABS
25.0000 mg | ORAL_TABLET | Freq: Every day | ORAL | Status: DC
  Administered 2022-06-14 – 2022-06-15 (×2): 25 mg via ORAL
  Filled 2022-06-14 (×2): qty 1

## 2022-06-14 MED ORDER — ONDANSETRON HCL 4 MG/2ML IJ SOLN
INTRAMUSCULAR | Status: AC
Start: 2022-06-14 — End: ?
  Filled 2022-06-14: qty 2

## 2022-06-14 MED ORDER — ACETAMINOPHEN 500 MG PO TABS
1000.0000 mg | ORAL_TABLET | Freq: Once | ORAL | Status: AC
  Administered 2022-06-14: 1000 mg via ORAL
  Filled 2022-06-14: qty 2

## 2022-06-14 MED ORDER — HEPARIN SODIUM (PORCINE) 1000 UNIT/ML IJ SOLN
INTRAMUSCULAR | Status: AC
  Filled 2022-06-14: qty 1

## 2022-06-14 MED ORDER — PROPOFOL 10 MG/ML IV BOLUS
INTRAVENOUS | Status: AC
  Filled 2022-06-14: qty 20

## 2022-06-14 SURGICAL SUPPLY — 66 items
APPLICATOR COTTON TIP 6 STRL (MISCELLANEOUS) ×2 IMPLANT
APPLICATOR COTTON TIP 6IN STRL (MISCELLANEOUS) ×3
BAG COUNTER SPONGE SURGICOUNT (BAG) IMPLANT
CATH FOLEY 2WAY SLVR 18FR 30CC (CATHETERS) ×3 IMPLANT
CATH ROBINSON RED A/P 16FR (CATHETERS) ×3 IMPLANT
CATH ROBINSON RED A/P 8FR (CATHETERS) ×3 IMPLANT
CATH TIEMANN FOLEY 18FR 5CC (CATHETERS) ×3 IMPLANT
CHLORAPREP W/TINT 26 (MISCELLANEOUS) ×3 IMPLANT
CLIP LIGATING HEM O LOK PURPLE (MISCELLANEOUS) ×6 IMPLANT
COVER SURGICAL LIGHT HANDLE (MISCELLANEOUS) ×3 IMPLANT
COVER TIP SHEARS 8 DVNC (MISCELLANEOUS) ×2 IMPLANT
COVER TIP SHEARS 8MM DA VINCI (MISCELLANEOUS) ×2
CUTTER ECHEON FLEX ENDO 45 340 (ENDOMECHANICALS) ×3 IMPLANT
DERMABOND ADVANCED (GAUZE/BANDAGES/DRESSINGS) ×1
DERMABOND ADVANCED .7 DNX12 (GAUZE/BANDAGES/DRESSINGS) ×2 IMPLANT
DRAIN CHANNEL RND F F (WOUND CARE) IMPLANT
DRAPE ARM DVNC X/XI (DISPOSABLE) ×8 IMPLANT
DRAPE COLUMN DVNC XI (DISPOSABLE) ×2 IMPLANT
DRAPE DA VINCI XI ARM (DISPOSABLE) ×4
DRAPE DA VINCI XI COLUMN (DISPOSABLE) ×1
DRAPE SURG IRRIG POUCH 19X23 (DRAPES) ×3 IMPLANT
DRSG TEGADERM 4X4.75 (GAUZE/BANDAGES/DRESSINGS) ×3 IMPLANT
ELECT PENCIL ROCKER SW 15FT (MISCELLANEOUS) ×3 IMPLANT
ELECT REM PT RETURN 15FT ADLT (MISCELLANEOUS) ×3 IMPLANT
GAUZE 4X4 16PLY ~~LOC~~+RFID DBL (SPONGE) ×3 IMPLANT
GAUZE SPONGE 4X4 12PLY STRL (GAUZE/BANDAGES/DRESSINGS) ×3 IMPLANT
GLOVE BIO SURGEON STRL SZ 6.5 (GLOVE) ×3 IMPLANT
GLOVE SURG LX 7.5 STRW (GLOVE) ×2
GLOVE SURG LX STRL 7.5 STRW (GLOVE) ×4 IMPLANT
GOWN SRG XL LVL 4 BRTHBL STRL (GOWNS) ×2 IMPLANT
GOWN STRL NON-REIN XL LVL4 (GOWNS) ×1
GOWN STRL REUS W/ TWL XL LVL3 (GOWN DISPOSABLE) ×4 IMPLANT
GOWN STRL REUS W/TWL XL LVL3 (GOWN DISPOSABLE) ×2
HOLDER FOLEY CATH W/STRAP (MISCELLANEOUS) ×3 IMPLANT
IRRIG SUCT STRYKERFLOW 2 WTIP (MISCELLANEOUS) ×3
IRRIGATION SUCT STRKRFLW 2 WTP (MISCELLANEOUS) ×2 IMPLANT
IV LACTATED RINGERS 1000ML (IV SOLUTION) ×3 IMPLANT
KIT TURNOVER KIT A (KITS) IMPLANT
NDL SAFETY ECLIPSE 18X1.5 (NEEDLE) ×2 IMPLANT
NEEDLE HYPO 18GX1.5 SHARP (NEEDLE) ×1
PACK ROBOT UROLOGY CUSTOM (CUSTOM PROCEDURE TRAY) ×3 IMPLANT
RELOAD STAPLE 45 4.1 GRN THCK (STAPLE) ×2 IMPLANT
SEAL CANN UNIV 5-8 DVNC XI (MISCELLANEOUS) ×8 IMPLANT
SEAL XI 5MM-8MM UNIVERSAL (MISCELLANEOUS) ×4
SET TUBE SMOKE EVAC HIGH FLOW (TUBING) ×3 IMPLANT
SOLUTION ELECTROLUBE (MISCELLANEOUS) ×3 IMPLANT
SPIKE FLUID TRANSFER (MISCELLANEOUS) ×3 IMPLANT
STAPLE RELOAD 45 GRN (STAPLE) ×2 IMPLANT
STAPLE RELOAD 45MM GREEN (STAPLE) ×1
SUT ETHILON 3 0 PS 1 (SUTURE) ×3 IMPLANT
SUT MNCRL 3 0 RB1 (SUTURE) ×2 IMPLANT
SUT MNCRL 3 0 VIOLET RB1 (SUTURE) ×2 IMPLANT
SUT MNCRL AB 4-0 PS2 18 (SUTURE) ×6 IMPLANT
SUT MONOCRYL 3 0 RB1 (SUTURE) ×2
SUT PDS PLUS 0 (SUTURE) ×2
SUT PDS PLUS AB 0 CT-2 (SUTURE) ×4 IMPLANT
SUT VIC AB 0 CT1 27 (SUTURE) ×2
SUT VIC AB 0 CT1 27XBRD ANTBC (SUTURE) ×4 IMPLANT
SUT VIC AB 2-0 SH 27 (SUTURE) ×1
SUT VIC AB 2-0 SH 27X BRD (SUTURE) ×2 IMPLANT
SYR 27GX1/2 1ML LL SAFETY (SYRINGE) ×3 IMPLANT
SYS BAG RETRIEVAL 10MM (BASKET) ×3
SYSTEM BAG RETRIEVAL 10MM (BASKET) IMPLANT
TOWEL OR NON WOVEN STRL DISP B (DISPOSABLE) ×3 IMPLANT
TROCAR Z-THREAD FIOS 5X100MM (TROCAR) IMPLANT
WATER STERILE IRR 1000ML POUR (IV SOLUTION) ×3 IMPLANT

## 2022-06-14 NOTE — Progress Notes (Signed)
Patient ID: Ernest Meyer, male   DOB: [birth year], 66 y.o.   MRN: [id number]  Post-op note  Subjective: The patient is doing well.  No complaints.  Objective: Vital signs in last 24 hours: Temp:  [97.6 F (36.4 C)-98.2 F (36.8 C)] 97.6 F (36.4 C) (08/07 1325) Pulse Rate:  [45-64] 52 (08/07 1325) Resp:  [10-20] 20 (08/07 1325) BP: (130-157)/(68-89) 143/70 (08/07 1325) SpO2:  [96 %-100 %] 97 % (08/07 1325) Weight:  [112.9 kg] 112.9 kg (08/07 0622)  Intake/Output from previous day: No intake/output data recorded. Intake/Output this shift: Total I/O In: 3000 [P.O.:1080; I.V.:1820; IV Piggyback:100] Out: 485 [Urine:225; Drains:160; Blood:100]  Physical Exam:  General: Alert and oriented. Abdomen: Soft, Nondistended. Incisions: Clean and dry. GU: Urine clear.  Lab Results: Recent Labs    06/14/22 1023  HGB 13.8  HCT 42.1    Assessment/Plan: POD#0   1) Continue to monitor, ambulate, IS   Ernest Curia. MD   LOS: 0 days   Dutch Gray 06/14/2022, 5:25 PM

## 2022-06-14 NOTE — Anesthesia Postprocedure Evaluation (Signed)
Anesthesia Post Note  Patient: Ernest Meyer  Procedure(s) Performed: XI ROBOTIC ASSISTED LAPAROSCOPIC RADICAL PROSTATECTOMY LEVEL 2 LYMPHADENECTOMY, PELVIC (Bilateral)     Patient location during evaluation: PACU Anesthesia Type: General Level of consciousness: awake and alert Pain management: pain level controlled Vital Signs Assessment: post-procedure vital signs reviewed and stable Respiratory status: spontaneous breathing, nonlabored ventilation, respiratory function stable and patient connected to nasal cannula oxygen Cardiovascular status: blood pressure returned to baseline and stable Postop Assessment: no apparent nausea or vomiting Anesthetic complications: no   No notable events documented.  Last Vitals:  Vitals:   06/14/22 1325 06/14/22 1738  BP: (!) 143/70 133/63  Pulse: (!) 52 62  Resp: 20 18  Temp: 36.4 C 36.8 C  SpO2: 97% 96%    Last Pain:  Vitals:   06/14/22 1738  TempSrc: Oral  PainSc:                  [name]

## 2022-06-14 NOTE — Plan of Care (Signed)
  Problem: Education: Goal: Knowledge of the procedure and recovery process will improve Outcome: Progressing   Problem: Bowel/Gastric: Goal: Gastrointestinal status for postoperative course will improve Outcome: Progressing   Problem: Pain Management: Goal: General experience of comfort will improve Outcome: Progressing   Problem: Urinary Elimination: Goal: Ability to avoid or minimize complications of infection will improve Outcome: Progressing

## 2022-06-14 NOTE — Op Note (Signed)
Preoperative diagnosis: Clinically localized adenocarcinoma of the prostate (clinical stage T1c N0 M0)  Postoperative diagnosis: Clinically localized adenocarcinoma of the prostate (clinical stage T1c N0 M0)  Procedure:  Robotic assisted laparoscopic radical prostatectomy (bilateral nerve sparing) Bilateral robotic assisted laparoscopic pelvic lymphadenectomy  Surgeon: Pryor Curia. M.D.  Assistant: Debbrah Alar, PA-C  An assistant was required for this surgical procedure.  The duties of the assistant included but were not limited to suctioning, passing suture, camera manipulation, retraction. This procedure would not be able to be performed without an Environmental consultant.  Anesthesia: General  Complications: None  EBL: 100 mL  IVF:  1700 mL crystalloid  Specimens: Prostate and seminal vesicles Right pelvic lymph nodes Left pelvic lymph nodes  Disposition of specimens: Pathology  Drains: 20 Fr coude catheter # 19 Blake pelvic drain  Indication: Ernest Meyer is a 66 y.o. year old patient with clinically localized prostate cancer.  After a thorough review of the management options for treatment of prostate cancer, he elected to proceed with surgical therapy and the above procedure(s).  We have discussed the potential benefits and risks of the procedure, side effects of the proposed treatment, the likelihood of the patient achieving the goals of the procedure, and any potential problems that might occur during the procedure or recuperation. Informed consent has been obtained.  Description of procedure:  The patient was taken to the operating room and a general anesthetic was administered. He was given preoperative antibiotics, placed in the dorsal lithotomy position, and prepped and draped in the usual sterile fashion. Next a preoperative timeout was performed. A urethral catheter was placed into the bladder and a site was selected near the umbilicus for placement of the camera port.  This was placed using a standard open Hassan technique which allowed entry into the peritoneal cavity under direct vision and without difficulty. An 8 mm robotic port was placed and a pneumoperitoneum established. The camera was then used to inspect the abdomen and there was no evidence of any intra-abdominal injuries or other abnormalities. The remaining abdominal ports were then placed. 8 mm robotic ports were placed in the right lower quadrant, left lower quadrant, and far left lateral abdominal wall. A 5 mm port was placed in the right upper quadrant and a 12 mm port was placed in the right lateral abdominal wall for laparoscopic assistance. All ports were placed under direct vision without difficulty. The surgical cart was then docked.   Utilizing the cautery scissors, the bladder was reflected posteriorly allowing entry into the space of Retzius and identification of the endopelvic fascia and prostate. The periprostatic fat was then removed from the prostate allowing full exposure of the endopelvic fascia. The endopelvic fascia was then incised from the apex back to the base of the prostate bilaterally and the underlying levator muscle fibers were swept laterally off the prostate thereby isolating the dorsal venous complex. The dorsal vein was then stapled and divided with a 45 mm Flex Echelon stapler. Attention then turned to the bladder neck which was divided anteriorly thereby allowing entry into the bladder and exposure of the urethral catheter. The catheter balloon was deflated and the catheter was brought into the operative field and used to retract the prostate anteriorly. The posterior bladder neck was then examined and was divided allowing further dissection between the bladder and prostate posteriorly until the vasa deferentia and seminal vessels were identified. The vasa deferentia were isolated, divided, and lifted anteriorly. The seminal vesicles were dissected down to their  tips with care to  control the seminal vascular arterial blood supply. These structures were then lifted anteriorly and the space between Denonvillier's fascia and the anterior rectum was developed with a combination of sharp and blunt dissection. This isolated the vascular pedicles of the prostate.  The lateral prostatic fascia was then sharply incised allowing release of the neurovascular bundles bilaterally. The vascular pedicles of the prostate were then ligated with Weck clips between the prostate and neurovascular bundles and divided with sharp cold scissor dissection resulting in neurovascular bundle preservation. The neurovascular bundles were then separated off the apex of the prostate and urethra bilaterally.  The urethra was then sharply transected allowing the prostate specimen to be disarticulated. The pelvis was copiously irrigated and hemostasis was ensured. There was no evidence for rectal injury.  Attention then turned to the right pelvic sidewall. The fibrofatty tissue between the external iliac vein, confluence of the iliac vessels, hypogastric artery, and Cooper's ligament was dissected free from the pelvic sidewall with care to preserve the obturator nerve. Weck clips were used for lymphostasis and hemostasis. An identical procedure was performed on the contralateral side and the lymphatic packets were removed for permanent pathologic analysis.  Attention then turned to the urethral anastomosis. A 2-0 Vicryl slip knot was placed between Denonvillier's fascia, the posterior bladder neck, and the posterior urethra to reapproximate these structures. A double-armed 3-0 Monocryl suture was then used to perform a 360 running tension-free anastomosis between the bladder neck and urethra. A new urethral catheter was then placed into the bladder and irrigated. There were no blood clots within the bladder and the anastomosis appeared to be watertight. A #19 Blake drain was then brought through the left lateral 8 mm  port site and positioned appropriately within the pelvis. It was secured to the skin with a nylon suture. The surgical cart was then undocked. The right lateral 12 mm port site was closed at the fascial level with a 0 Vicryl suture placed laparoscopically. All remaining ports were then removed under direct vision. The prostate specimen was removed intact within the Endopouch retrieval bag via the periumbilical camera port site. This fascial opening was closed with two running 0 PDS sutures. 0.25% Marcaine was then injected into all port sites and all incisions were reapproximated at the skin level with 4-0 Monocryl subcuticular sutures and Dermabond. The patient appeared to tolerate the procedure well and without complications. The patient was able to be extubated and transferred to the recovery unit in satisfactory condition.   Pryor Curia MD

## 2022-06-14 NOTE — Discharge Instructions (Signed)

## 2022-06-14 NOTE — Anesthesia Preprocedure Evaluation (Addendum)
Anesthesia Evaluation  Patient identified by MRN, date of birth, ID band Patient awake    Reviewed: Allergy & Precautions, NPO status , Patient's Chart, lab work & pertinent test results  Airway Mallampati: III  TM Distance: >3 FB Neck ROM: Full    Dental  (+) Dental Advisory Given   Pulmonary sleep apnea and Continuous Positive Airway Pressure Ventilation , former smoker,    breath sounds clear to auscultation       Cardiovascular hypertension, Pt. on medications  Rhythm:Regular Rate:Normal     Neuro/Psych  Neuromuscular disease    GI/Hepatic Neg liver ROS, GERD  ,  Endo/Other  negative endocrine ROS  Renal/GU negative Renal ROS     Musculoskeletal  (+) Arthritis ,   Abdominal   Peds  Hematology negative hematology ROS (+)   Anesthesia Other Findings   Reproductive/Obstetrics                            Anesthesia Physical Anesthesia Plan  ASA: 2  Anesthesia Plan: General   Post-op Pain Management: Tylenol PO (pre-op)* and Toradol IV (intra-op)*   Induction: Intravenous  PONV Risk Score and Plan: 2 and Dexamethasone, Ondansetron and Treatment may vary due to age or medical condition  Airway Management Planned: Oral ETT  Additional Equipment:   Intra-op Plan:   Post-operative Plan: Extubation in OR  Informed Consent: I have reviewed the patients History and Physical, chart, labs and discussed the procedure including the risks, benefits and alternatives for the proposed anesthesia with the patient or authorized representative who has indicated his/her understanding and acceptance.     Dental advisory given  Plan Discussed with: CRNA  Anesthesia Plan Comments:         Anesthesia Quick Evaluation

## 2022-06-14 NOTE — Anesthesia Procedure Notes (Signed)
Procedure Name: Intubation Date/Time: 06/14/2022 7:27 AM  Performed by: Victoriano Lain, CRNAPre-anesthesia Checklist: Patient identified, Emergency Drugs available, Suction available, Patient being monitored and Timeout performed Patient Re-evaluated:Patient Re-evaluated prior to induction Oxygen Delivery Method: Circle system utilized Preoxygenation: Pre-oxygenation with 100% oxygen Induction Type: IV induction Ventilation: Mask ventilation without difficulty and Oral airway inserted - appropriate to patient size Laryngoscope Size: Mac and 4 Grade View: Grade I Tube type: Oral Tube size: 7.5 mm Number of attempts: 1 Airway Equipment and Method: Stylet Placement Confirmation: ETT inserted through vocal cords under direct vision, positive ETCO2 and breath sounds checked- equal and bilateral Secured at: 23 cm Tube secured with: Tape Dental Injury: Teeth and Oropharynx as per pre-operative assessment

## 2022-06-14 NOTE — Progress Notes (Signed)
Placed pt on cpap auto settings.

## 2022-06-14 NOTE — Interval H&P Note (Signed)
History and Physical Interval Note:  06/14/2022 6:57 AM  Ernest Meyer  has presented today for surgery, with the diagnosis of PROSTATE CANCER.  The various methods of treatment have been discussed with the patient and family. After consideration of risks, benefits and other options for treatment, the patient has consented to  Procedure(s): XI ROBOTIC ASSISTED LAPAROSCOPIC RADICAL PROSTATECTOMY LEVEL 2 (N/A) LYMPHADENECTOMY, PELVIC (Bilateral) as a surgical intervention.  The patient's history has been reviewed, patient examined, no change in status, stable for surgery.  I have reviewed the patient's chart and labs.  Questions were answered to the patient's satisfaction.     [employer]

## 2022-06-14 NOTE — Transfer of Care (Signed)
Immediate Anesthesia Transfer of Care Note  Patient: Ernest Meyer  Procedure(s) Performed: XI ROBOTIC ASSISTED LAPAROSCOPIC RADICAL PROSTATECTOMY LEVEL 2 LYMPHADENECTOMY, PELVIC (Bilateral)  Patient Location: PACU  Anesthesia Type:General  Level of Consciousness: awake, alert , oriented and patient cooperative  Airway & Oxygen Therapy: Patient Spontanous Breathing and Patient connected to face mask oxygen  Post-op Assessment: Report given to RN, Post -op Vital signs reviewed and stable and Patient moving all extremities  Post vital signs: Reviewed and stable  Last Vitals:  Vitals Value Taken Time  BP 152/85 06/14/22 1011  Temp    Pulse 57 06/14/22 1014  Resp 15 06/14/22 1014  SpO2 100 % 06/14/22 1014  Vitals shown include unvalidated device data.  Last Pain:  Vitals:   06/14/22 0622  TempSrc:   PainSc: 0-No pain      Patients Stated Pain Goal: 3 (10/26/74 8832)  Complications: No notable events documented.

## 2022-06-15 ENCOUNTER — Encounter (HOSPITAL_COMMUNITY): Payer: Self-pay | Admitting: Urology

## 2022-06-15 DIAGNOSIS — C61 Malignant neoplasm of prostate: Secondary | ICD-10-CM | POA: Diagnosis not present

## 2022-06-15 LAB — HEMOGLOBIN AND HEMATOCRIT, BLOOD
HCT: 34.9 % — ABNORMAL LOW (ref 39.0–52.0)
Hemoglobin: 11.6 g/dL — ABNORMAL LOW (ref 13.0–17.0)

## 2022-06-15 MED ORDER — TRAMADOL HCL 50 MG PO TABS: 50.0000 mg | ORAL_TABLET | Freq: Four times a day (QID) | ORAL | Status: DC | PRN

## 2022-06-15 MED ORDER — BISACODYL 10 MG RE SUPP
10.0000 mg | Freq: Once | RECTAL | Status: AC
  Administered 2022-06-15: 10 mg via RECTAL
  Filled 2022-06-15: qty 1

## 2022-06-15 NOTE — Discharge Summary (Signed)
  Date of admission: 06/14/2022  Date of discharge: 06/15/2022  Admission diagnosis: Prostate Cancer  Discharge diagnosis: Prostate Cancer  History and Physical: For full details, please see admission history and physical. Briefly, Ernest Meyer is a 66 y.o. gentleman with localized prostate cancer.  After discussing management/treatment options, he elected to proceed with surgical treatment.  Hospital Course: Sabri Teal was taken to the operating room on 06/14/2022 and underwent a robotic assisted laparoscopic radical prostatectomy. He tolerated this procedure well and without complications. Postoperatively, he was able to be transferred to a regular hospital room following recovery from anesthesia.  He was able to begin ambulating the night of surgery. He remained hemodynamically stable overnight.  He had excellent urine output with appropriately minimal output from his pelvic drain and his pelvic drain was removed on POD #1.  He was transitioned to oral pain medication, tolerated a clear liquid diet, and had met all discharge criteria and was able to be discharged home later on POD#1.  Laboratory values:  Recent Labs    06/14/22 1023 06/15/22 0329  HGB 13.8 11.6*  HCT 42.1 34.9*    Disposition: Home  Discharge instruction: He was instructed to be ambulatory but to refrain from heavy lifting, strenuous activity, or driving. He was instructed on urethral catheter care.  Discharge medications:   Allergies as of 06/15/2022   No Known Allergies      Medication List     TAKE these medications    atorvastatin 40 MG tablet Commonly known as: LIPITOR Take 40 mg by mouth at bedtime.   clonazePAM 0.5 MG tablet Commonly known as: KLONOPIN Take 0.5 mg by mouth 2 (two) times daily as needed for anxiety.   docusate sodium 100 MG capsule Commonly known as: COLACE Take 1 capsule (100 mg total) by mouth 2 (two) times daily.   ezetimibe 10 MG tablet Commonly known as: ZETIA Take 10 mg by  mouth daily.   hydrochlorothiazide 25 MG tablet Commonly known as: HYDRODIURIL Take 25 mg by mouth daily.   losartan 100 MG tablet Commonly known as: COZAAR Take 100 mg by mouth daily.   pantoprazole 40 MG tablet Commonly known as: PROTONIX Take 40 mg by mouth daily as needed (Heartburn).   Shingrix injection Generic drug: Zoster Vaccine Adjuvanted INJECT 0.5ML INTRAMUSCULARLY AS DIRECTEDAND REPEAT IN 2 TO 6 MONTHS   sulfamethoxazole-trimethoprim 800-160 MG tablet Commonly known as: BACTRIM DS Take 1 tablet by mouth 2 (two) times daily. Start the day prior to foley removal appointment   traMADol 50 MG tablet Commonly known as: Ultram Take 1-2 tablets (50-100 mg total) by mouth every 6 (six) hours as needed for moderate pain or severe pain.   Visine 0.05 % ophthalmic solution Generic drug: tetrahydrozoline Place 1 drop into both eyes daily at 12 noon.        Followup: He will followup in 1 week for catheter removal and to discuss his surgical pathology results.

## 2022-06-15 NOTE — Progress Notes (Signed)
Patient ID: Ernest Meyer, male   DOB: 1956-09-23, 66 y.o.   MRN: [id number]  1 Day Post-Op Subjective: The patient is doing well.  No nausea or vomiting. Pain is adequately controlled.  Objective: Vital signs in last 24 hours: Temp:  [97.6 F (36.4 C)-98.8 F (37.1 C)] 98.7 F (37.1 C) (08/08 0534) Pulse Rate:  [45-64] 54 (08/08 0534) Resp:  [10-20] 20 (08/08 0534) BP: (104-157)/(59-89) 107/59 (08/08 0534) SpO2:  [96 %-100 %] 97 % (08/08 0534)  Intake/Output from previous day: 08/07 0701 - 08/08 0700 In: 5103.7 [P.O.:1130; I.V.:3823.7; IV Piggyback:150] Out: 2895 [Urine:2525; Drains:270; Blood:100] Intake/Output this shift: No intake/output data recorded.  Physical Exam:  General: Alert and oriented. CV: RRR Lungs: Clear bilaterally. GI: Soft, Nondistended. Incisions: Clean, dry, and intact Urine: Clear Extremities: Nontender, no erythema, no edema.  Lab Results: Recent Labs    06/14/22 1023 06/15/22 0329  HGB 13.8 11.6*  HCT 42.1 34.9*      Assessment/Plan: POD# 1 s/p robotic prostatectomy.  1) SL IVF 2) Ambulate, Incentive spirometry 3) Transition to oral pain medication 4) Dulcolax suppository 5) D/C pelvic drain 6) Plan for likely discharge later today   Ernest Meyer. MD   LOS: 0 days   Ernest Meyer 06/15/2022, 7:45 AM

## 2022-06-17 NOTE — Plan of Care (Signed)
  Problem: Education: Goal: Knowledge of General Education information will improve Description: Including pain rating scale, medication(s)/side effects and non-pharmacologic comfort measures Outcome: Completed/Met   Problem: Health Behavior/Discharge Planning: Goal: Ability to manage health-related needs will improve Outcome: Completed/Met   Problem: Clinical Measurements: Goal: Ability to maintain clinical measurements within normal limits will improve Outcome: Completed/Met Goal: Will remain free from infection Outcome: Completed/Met Goal: Diagnostic test results will improve Outcome: Completed/Met Goal: Respiratory complications will improve Outcome: Completed/Met Goal: Cardiovascular complication will be avoided Outcome: Completed/Met   Problem: Activity: Goal: Risk for activity intolerance will decrease Outcome: Completed/Met   Problem: Nutrition: Goal: Adequate nutrition will be maintained Outcome: Completed/Met   Problem: Coping: Goal: Level of anxiety will decrease Outcome: Completed/Met   Problem: Elimination: Goal: Will not experience complications related to bowel motility Outcome: Completed/Met Goal: Will not experience complications related to urinary retention Outcome: Completed/Met   Problem: Pain Managment: Goal: General experience of comfort will improve Outcome: Completed/Met   Problem: Safety: Goal: Ability to remain free from injury will improve Outcome: Completed/Met   Problem: Skin Integrity: Goal: Risk for impaired skin integrity will decrease Outcome: Completed/Met   Problem: Education: Goal: Knowledge of the procedure and recovery process will improve Outcome: Completed/Met   Problem: Bowel/Gastric: Goal: Gastrointestinal status for postoperative course will improve Outcome: Completed/Met   Problem: Pain Management: Goal: General experience of comfort will improve Outcome: Completed/Met   Problem: Skin Integrity: Goal:  Demonstration of wound healing without infection will improve Outcome: Completed/Met   Problem: Urinary Elimination: Goal: Ability to avoid or minimize complications of infection will improve Outcome: Completed/Met Goal: Ability to achieve and maintain urine output will improve Outcome: Completed/Met Goal: Home care management will improve Outcome: Completed/Met   

## 2022-06-19 LAB — SURGICAL PATHOLOGY

## 2023-07-27 ENCOUNTER — Encounter (HOSPITAL_BASED_OUTPATIENT_CLINIC_OR_DEPARTMENT_OTHER): Payer: Self-pay | Admitting: Emergency Medicine

## 2023-07-27 ENCOUNTER — Other Ambulatory Visit: Payer: Self-pay

## 2023-07-27 ENCOUNTER — Emergency Department (HOSPITAL_BASED_OUTPATIENT_CLINIC_OR_DEPARTMENT_OTHER): Payer: Medicare Other

## 2023-07-27 ENCOUNTER — Emergency Department (HOSPITAL_BASED_OUTPATIENT_CLINIC_OR_DEPARTMENT_OTHER)
Admission: EM | Admit: 2023-07-27 | Discharge: 2023-07-27 | Disposition: A | Discharge status: Home / Self Care | Payer: Medicare Other | Attending: Emergency Medicine | Admitting: Emergency Medicine

## 2023-07-27 DIAGNOSIS — Z79899 Other long term (current) drug therapy: Secondary | ICD-10-CM | POA: Diagnosis not present

## 2023-07-27 DIAGNOSIS — Y92480 Sidewalk as the place of occurrence of the external cause: Secondary | ICD-10-CM | POA: Insufficient documentation

## 2023-07-27 DIAGNOSIS — S93401A Sprain of unspecified ligament of right ankle, initial encounter: Secondary | ICD-10-CM | POA: Diagnosis not present

## 2023-07-27 DIAGNOSIS — M79671 Pain in right foot: Secondary | ICD-10-CM | POA: Diagnosis present

## 2023-07-27 DIAGNOSIS — X501XXA Overexertion from prolonged static or awkward postures, initial encounter: Secondary | ICD-10-CM | POA: Diagnosis not present

## 2023-07-27 DIAGNOSIS — I10 Essential (primary) hypertension: Secondary | ICD-10-CM | POA: Diagnosis not present

## 2023-07-27 DIAGNOSIS — S92354A Nondisplaced fracture of fifth metatarsal bone, right foot, initial encounter for closed fracture: Secondary | ICD-10-CM | POA: Insufficient documentation

## 2023-07-27 MED ORDER — ACETAMINOPHEN 500 MG PO TABS
1000.0000 mg | ORAL_TABLET | Freq: Once | ORAL | Status: AC
  Administered 2023-07-27: 1000 mg via ORAL
  Filled 2023-07-27: qty 2

## 2023-07-27 NOTE — ED Provider Notes (Addendum)
[facility] Provider Note   CSN: [id number] Arrival date & time: 07/27/23  2725     History  Chief Complaint  Patient presents with   Foot Injury    Ernest Meyer is a 67 y.o. male with PMH as listed below who presents with right foot and ankle pain after stepping wrong off of a curb on Monday, and rolling his ankle. Pt states he felt a pop, swelling noted to foot and ankle. States most of the pain is in the lateral foot.  He has been ambulatory on the foot since that time with mild pain.  Took Tylenol yesterday which mostly controlled his pain.  Denies any numbness tingling.  Otherwise was in his normal state of health.  Past Medical History:  Diagnosis Date   Arthritis    Hyperlipidemia    Hypertension    Sleep apnea    on CPAP       Home Medications Prior to Admission medications   Medication Sig Start Date End Date Taking? Authorizing Provider  atorvastatin (LIPITOR) 40 MG tablet Take 40 mg by mouth at bedtime. 02/28/17   [provider]  clonazePAM (KLONOPIN) 0.5 MG tablet Take 0.5 mg by mouth 2 (two) times daily as needed for anxiety. 02/18/22   [provider]  docusate sodium (COLACE) 100 MG capsule Take 1 capsule (100 mg total) by mouth 2 (two) times daily. 06/14/22   Harrie Foreman, PA-C  ezetimibe (ZETIA) 10 MG tablet Take 10 mg by mouth daily. 12/01/21   [provider]  hydrochlorothiazide (HYDRODIURIL) 25 MG tablet Take 25 mg by mouth daily. 02/01/22   [provider]  losartan (COZAAR) 100 MG tablet Take 100 mg by mouth daily.    [provider]  pantoprazole (PROTONIX) 40 MG tablet Take 40 mg by mouth daily as needed (Heartburn). 07/29/21   [provider]  sulfamethoxazole-trimethoprim (BACTRIM DS) 800-160 MG tablet Take 1 tablet by mouth 2 (two) times daily. Start the day prior to foley removal appointment 06/14/22   Harrie Foreman, PA-C  tetrahydrozoline (VISINE) 0.05 %  ophthalmic solution Place 1 drop into both eyes daily at 12 noon.    [provider]  traMADol (ULTRAM) 50 MG tablet Take 1-2 tablets (50-100 mg total) by mouth every 6 (six) hours as needed for moderate pain or severe pain. 06/14/22   Harrie Foreman, PA-C  Zoster Vaccine Adjuvanted [facility]) injection INJECT 0.5ML INTRAMUSCULARLY AS DIRECTEDAND REPEAT IN 2 TO 6 MONTHS 11/05/21   [provider]      Allergies    Patient has no allergy information on record.    Review of Systems   Review of Systems A 10 point review of systems was performed and is negative unless otherwise reported in HPI.  Physical Exam Updated Vital Signs BP (!) 146/81   Pulse (!) 56   Temp 99.1 F (37.3 C)   Resp 18   Ht 5\' 10"  (1.778 m)   Wt 109.8 kg   SpO2 98%   BMI 34.72 kg/m  Physical Exam General: Normal appearing male, lying in bed.  HEENT:  Sclera anicteric, MMM, trachea midline.  Cardiology: RRR Resp: Normal respiratory rate and effort.  Abd: Soft, non-tender, non-distended. No rebound tenderness or guarding.  GU: Deferred. MSK: Mild swelling to R lateral ankle and lateral midfoot. No gross deformities. No erythema/induration/wounds/ecchymoses. TTP to lateral ankle, lateral midfoot, R 5th metatarsal. Soft compartments. NVI. R PT/DP pulses intact. Skin: warm, dry. Neuro:  A&Ox4, CNs II-XII grossly intact. MAEs. Sensation grossly intact.  Psych: Normal mood and affect.   ED Results / Procedures / Treatments   Labs (all labs ordered are listed, but only abnormal results are displayed) Labs Reviewed - No data to display  EKG None  Radiology DG Foot Complete Right  Result Date: 07/27/2023 CLINICAL DATA:  Popping sensation after stepping off curb with right foot pain and swelling EXAM: RIGHT FOOT COMPLETE - 3 VIEW COMPARISON:  None Available. FINDINGS: Fracture: Transverse fracture of the proximal fifth metatarsal shaft. Healing: None. Soft tissue: Mild soft tissue swelling over the  lateral foot. IMPRESSION: Nondisplaced transverse fracture of the proximal fifth metatarsal shaft. Electronically Signed   By: Agustin Cree M.D.   On: 07/27/2023 08:30    Procedures Procedures    Medications Ordered in ED Medications  acetaminophen (TYLENOL) tablet 1,000 mg (1,000 mg Oral Given 07/27/23 0272)    ED Course/ Medical Decision Making/ A&P                          Medical Decision Making Amount and/or Complexity of Data Reviewed Radiology: ordered. Decision-making details documented in ED Course.  Risk OTC drugs.    This patient presents to the ED for concern of R ankle/foot pain, this involves an extensive number of treatment options, and is a complaint that carries with it a high risk of complications and morbidity.   MDM:    Patient seen and evaluated. VSS, afebrile, well appearing. Physical exam pertinent for tenderness palpation of the anterior talofibular ligament and R 5th metatarsal. DDx includes, but is not limited to: Right ankle sprain is most likely diagnosis. High degree of concern for a fifth metatarsal fracture given point TTP over that area. He is able to bear weight but with pain.  Also considered, but less likely based on H&P is a Lisfranc fracture, however the patient did not have a high mechanism injury.  Also considered a fibular fracture however the patient does not have exquisite swelling and no x-ray evidence of this fracture. Also palpated over the proximal fibula with no palpation suggesting that there is no fracture in association with this ankle injury. Patient is neurovascularly intact beyond the injury. Compartments soft, no c/f compartment syndrome.   Clinical Course as of 07/27/23 1024  Wed Jul 27, 2023  5366 DG Foot Complete Right Nondisplaced transverse fracture of the proximal fifth metatarsal shaft.   [HN]  [phone] Patient found to have a zone 3 fifth metatarsal fracture.  This is treated with short leg splint and nonweightbearing with  crutches.  Short leg splint is applied by RN and patient is neurovascularly intact after the splint is applied.  Patient will be discharged with instructions to follow-up with orthopedic surgery within 1 to 2 weeks and take Tylenol at home for pain, as he states this did control his pain.  Given discharge instructions and return precautions, all questions answered to patient satisfaction. [HN]  1024 Discussed with Earney Hamburg.  Patient will follow-up in 2 weeks with Dr. Odis Hollingshead in [place].  Patient reports understanding. [HN]    Clinical Course User Index [HN] Loetta Rough, MD    Imaging Studies ordered: I ordered imaging studies including R foot XR I independently visualized and interpreted imaging. I agree with the radiologist interpretation  Additional history obtained from chart review.    Reevaluation: After the interventions noted above, I reevaluated the patient and found that they have :improved  Social Determinants of Health: Lives independently  Disposition:  DC w/ discharge instructions/return precautions. All questions answered to patient's satisfaction.    Co morbidities that complicate the patient evaluation  Past Medical History:  Diagnosis Date   Arthritis    Hyperlipidemia    Hypertension    Sleep apnea    on CPAP     Medicines Meds ordered this encounter  Medications   acetaminophen (TYLENOL) tablet 1,000 mg    I have reviewed the patients home medicines and have made adjustments as needed  Problem List / ED Course: Problem List Items Addressed This Visit   None Visit Diagnoses     Closed nondisplaced fracture of fifth metatarsal bone of right foot, initial encounter    -  Primary   Sprain of right ankle, unspecified ligament, initial encounter                 Loetta Rough, MD 07/27/23 1027

## 2023-07-27 NOTE — ED Triage Notes (Signed)
Pt ambulatory to triage with c/o right foot and ankle pain after stepping wrong off of a curb on Monday. Pt states he felt a pop, swelling noted to foot and ankle.  States most of the pain is in the foot.

## 2023-07-27 NOTE — Discharge Instructions (Addendum)
Thank you for coming to [facility] Emergency Department. You were seen for ankle/foot pain. We did an exam, and imaging, and these showed like an transverse fracture of the 5th metatarsal shaft.  You had a splint placed in the emergency department and were given crutches.  Please do not bear weight on the right foot.  You can keep it elevated and use ice to help keep the swelling down.  You can alternate taking Tylenol and ibuprofen as needed for pain. You can take 650mg  tylenol (acetaminophen) every 4-6 hours, and 600 mg ibuprofen 3 times a day.   Please follow-up with orthopedic surgeon Dr. Odis Hollingshead in 2 weeks.  You can call his office to make an appointment.  Do not hesitate to return to the ED or call 911 if you experience: -Worsening symptoms -Numbness/tingling -Lightheadedness, passing out -Fevers/chills -Anything else that concerns you
